# Patient Record
Sex: Female | Born: 1990 | Race: Black or African American | Hispanic: No | Marital: Single
Health system: Southern US, Community
[De-identification: ages and names within clinical notes are randomized; demographics above are authoritative.]

---
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
## 2022-07-07 ENCOUNTER — Emergency Department (HOSPITAL_COMMUNITY): Payer: BC Managed Care – PPO

## 2022-07-07 ENCOUNTER — Emergency Department (HOSPITAL_COMMUNITY)
Admission: EM | Admit: 2022-07-07 | Discharge: 2022-07-08 | Disposition: A | Discharge status: Home / Self Care | Payer: BC Managed Care – PPO | Attending: Emergency Medicine | Admitting: Emergency Medicine

## 2022-07-07 ENCOUNTER — Other Ambulatory Visit: Payer: Self-pay

## 2022-07-07 DIAGNOSIS — R8290 Unspecified abnormal findings in urine: Secondary | ICD-10-CM

## 2022-07-07 DIAGNOSIS — R1900 Intra-abdominal and pelvic swelling, mass and lump, unspecified site: Secondary | ICD-10-CM

## 2022-07-07 DIAGNOSIS — R1031 Right lower quadrant pain: Secondary | ICD-10-CM | POA: Insufficient documentation

## 2022-07-07 DIAGNOSIS — N9489 Other specified conditions associated with female genital organs and menstrual cycle: Secondary | ICD-10-CM | POA: Insufficient documentation

## 2022-07-07 DIAGNOSIS — R1032 Left lower quadrant pain: Secondary | ICD-10-CM | POA: Insufficient documentation

## 2022-07-07 LAB — COMPREHENSIVE METABOLIC PANEL
ALT: 14 U/L (ref 0–44)
AST: 16 U/L (ref 15–41)
Albumin: 4.1 g/dL (ref 3.5–5.0)
Alkaline Phosphatase: 55 U/L (ref 38–126)
Anion gap: 9 (ref 5–15)
BUN: 10 mg/dL (ref 6–20)
CO2: 22 mmol/L (ref 22–32)
Calcium: 9 mg/dL (ref 8.9–10.3)
Chloride: 108 mmol/L (ref 98–111)
Creatinine, Ser: 0.83 mg/dL (ref 0.44–1.00)
GFR, Estimated: >60 mL/min (ref >60)
Glucose, Bld: 93 mg/dL (ref 70–99)
Potassium: 4.1 mmol/L (ref 3.5–5.1)
Sodium: 139 mmol/L (ref 135–145)
Total Bilirubin: 0.6 mg/dL (ref 0.3–1.2)
Total Protein: 7.8 g/dL (ref 6.5–8.1)

## 2022-07-07 LAB — URINALYSIS, ROUTINE W REFLEX MICROSCOPIC
Bilirubin Urine: NEGATIVE
Glucose, UA: NEGATIVE mg/dL
Ketones, ur: 15 mg/dL — AB
Nitrite: NEGATIVE
Protein, ur: NEGATIVE mg/dL
Specific Gravity, Urine: >=1.03 (ref 1.005–1.030)
pH: 6 (ref 5.0–8.0)

## 2022-07-07 LAB — CBC WITH DIFFERENTIAL/PLATELET
Abs Immature Granulocytes: 0.02 10*3/uL (ref 0.00–0.07)
Basophils Absolute: 0 10*3/uL (ref 0.0–0.1)
Basophils Relative: 0 %
Eosinophils Absolute: 0.1 10*3/uL (ref 0.0–0.5)
Eosinophils Relative: 1 %
HCT: 37.5 % (ref 36.0–46.0)
Hemoglobin: 12.8 g/dL (ref 12.0–15.0)
Immature Granulocytes: 0 %
Lymphocytes Relative: 24 %
Lymphs Abs: 1.8 10*3/uL (ref 0.7–4.0)
MCH: 30.9 pg (ref 26.0–34.0)
MCHC: 34.1 g/dL (ref 30.0–36.0)
MCV: 90.6 fL (ref 80.0–100.0)
Monocytes Absolute: 0.7 10*3/uL (ref 0.1–1.0)
Monocytes Relative: 9 %
Neutro Abs: 4.9 10*3/uL (ref 1.7–7.7)
Neutrophils Relative %: 66 %
Platelets: 292 10*3/uL (ref 150–400)
RBC: 4.14 MIL/uL (ref 3.87–5.11)
RDW: 13 % (ref 11.5–15.5)
WBC: 7.4 10*3/uL (ref 4.0–10.5)
nRBC: 0 % (ref 0.0–0.2)

## 2022-07-07 LAB — I-STAT BETA HCG BLOOD, ED (MC, WL, AP ONLY): I-stat hCG, quantitative: <5 m[IU]/mL (ref <5)

## 2022-07-07 LAB — LIPASE, BLOOD: Lipase: 31 U/L (ref 11–51)

## 2022-07-07 MED ORDER — IOHEXOL 300 MG/ML  SOLN
100.0000 mL | Freq: Once | INTRAMUSCULAR | Status: AC | PRN
  Administered 2022-07-07: 80 mL via INTRAVENOUS

## 2022-07-07 MED ORDER — MORPHINE SULFATE (PF) 4 MG/ML IV SOLN
4.0000 mg | Freq: Once | INTRAVENOUS | Status: AC
  Administered 2022-07-07: 4 mg via INTRAVENOUS
  Filled 2022-07-07: qty 1

## 2022-07-07 MED ORDER — SODIUM CHLORIDE 0.9 % IV SOLN
1.0000 g | Freq: Once | INTRAVENOUS | Status: AC
  Administered 2022-07-08: 1 g via INTRAVENOUS
  Filled 2022-07-07: qty 10

## 2022-07-07 MED ORDER — SODIUM CHLORIDE (PF) 0.9 % IJ SOLN
INTRAMUSCULAR | Status: AC
  Filled 2022-07-07: qty 50

## 2022-07-07 NOTE — ED Provider Notes (Signed)
[facility]-EMERGENCY DEPT Provider Note   CSN: [id number] Arrival date & time: 07/07/22  1855     History  Chief Complaint  Patient presents with   Abdominal Pain    Chelsea Huffman is a 31 y.o. female.  With history of autoimmune arthritis initially showed acute presents ED for evaluation of bilateral lower quadrant abdominal pain radiating chest pain started 4 days ago.  It has gotten worse pain at the time and is difficult to sleep through the night.  Pain is constant and she describes it as sharp and shooting.  It does occasionally radiate to the back.  There is associated nausea with 1 episode of emesis this morning.  She rates the pain at a 7 out of 10 at this time.  She also has dysuria, frequency and urgency.  States she has had UTIs in the past and that this does not feel like a UTI.  She believes it feels worse.  She denies chest pain, shortness of breath, fevers, chills, diarrhea, vaginal symptoms.  She reports she did start her menstrual cycle on 07/03/2022.  She took an Excedrin this morning which did relieve her symptoms for approximately 4 hours.   Abdominal Pain Associated symptoms: dysuria, nausea and vomiting        Home Medications Prior to Admission medications   Not on File      Allergies    Nortriptyline    Review of Systems   Review of Systems  Gastrointestinal:  Positive for abdominal pain, nausea and vomiting.  Genitourinary:  Positive for dysuria, frequency and urgency.  All other systems reviewed and are negative.   Physical Exam Updated Vital Signs BP (!) 115/94   Pulse 95   Temp 98.1 F (36.7 C) (Oral)   Resp 16   Ht 5\' 5"  (1.651 m)   Wt 62.1 kg   LMP 07/07/2022 (Approximate)   SpO2 100%   BMI 22.80 kg/m  Physical Exam Vitals and nursing note reviewed.  Constitutional:      General: She is not in acute distress.    Appearance: She is well-developed and normal weight. She is not ill-appearing, toxic-appearing  or diaphoretic.  HENT:     Head: Normocephalic and atraumatic.  Eyes:     Conjunctiva/sclera: Conjunctivae normal.  Cardiovascular:     Rate and Rhythm: Normal rate and regular rhythm.     Heart sounds: No murmur heard. Pulmonary:     Effort: Pulmonary effort is normal. No respiratory distress.     Breath sounds: Normal breath sounds. No stridor. No wheezing, rhonchi or rales.  Abdominal:     Palpations: Abdomen is soft.     Tenderness: There is abdominal tenderness in the right lower quadrant and left lower quadrant. There is no right CVA tenderness, left CVA tenderness, guarding or rebound. Negative signs include Murphy's sign, Rovsing's sign, McBurney's sign, psoas sign and obturator sign.  Musculoskeletal:        General: No swelling.     Cervical back: Neck supple.  Skin:    General: Skin is warm and dry.     Capillary Refill: Capillary refill takes less than 2 seconds.  Neurological:     Mental Status: She is alert.  Psychiatric:        Mood and Affect: Mood normal.     ED Results / Procedures / Treatments   Labs (all labs ordered are listed, but only abnormal results are displayed) Labs Reviewed  CBC WITH DIFFERENTIAL/PLATELET  COMPREHENSIVE METABOLIC  PANEL  LIPASE, BLOOD  URINALYSIS, ROUTINE W REFLEX MICROSCOPIC  I-STAT BETA HCG BLOOD, ED ([facility], [facility], [facility] ONLY)    EKG None  Radiology No results found.  Procedures Procedures    Medications Ordered in ED Medications  morphine (PF) 4 MG/ML injection 4 mg (4 mg Intravenous Given 07/07/22 2140)    ED Course/ Medical Decision Making/ A&P Clinical Course as of 07/07/22 2203  Mon Jul 07, 2022  2153 Reevaluation shows patient's pain has improved with IV pain medication. Resting comfortably in bed. [AS]    Clinical Course User Index [AS] Cyrah Mclamb, Edsel Petrin, PA-C                     [facility] Syncope Rule Score: 0      Medical Decision Making Amount and/or Complexity of Data Reviewed Radiology:  ordered.  Risk Prescription drug management.  This patient presents to the ED for concern of LLQ and RLQ abdominal pain, this involves an extensive number of treatment options, and is a complaint that carries with it a high risk of complications and morbidity.  The differential diagnosis for generalized abdominal pain includes, but is not limited to AAA, gastroenteritis, appendicitis, Bowel obstruction, Bowel perforation. Gastroparesis, DKA, Hernia, Inflammatory bowel disease, mesenteric ischemia, pancreatitis, peritonitis SBP, volvulus.   Co morbidities that complicate the patient evaluation  reported autoimmune arthritis  My initial workup includes abdominal pain labs, IV pain control, CT abdomen pelvis  Additional history obtained from: Nursing notes from this visit.  I ordered, reviewed and interpreted labs which include: BMP, CBC, lipase, beta-hCG, urinalysis.  All blood labs within normal limits.  Urinalysis pending at the time of shift change.  I ordered imaging studies including CT abdomen pelvis I independently visualized and interpreted imaging which showed pending at the time of shift change I agree with the radiologist interpretation  Afebrile, hemodynamically stable.  31 year old female presenting to the ED for evaluation of bilateral lower quadrant abdominal pain.  Her physical exam is remarkable for tenderness to palpation bilateral lower quadrants.  She does have urinary symptoms as well, but states it does not feel like UTI.  Shared decision making conversation was had with patient regarding imaging of the abdomen.  Patient reports that she is here traveling from out of town and would like to make sure nothing is wrong.  Will have low suspicion for acute intraabdominal abnormalities as the cause of her symptoms.  Her symptoms did start the day that her menstrual cycle started and this may be contributing.  She also states that her autoimmune arthritis may be contributing as  well.  Her history is concerning for UTI.  Patient care will be handed off to [place], [place] pending CT scan and urinalysis.  If scan is normal, she will be treated for her symptoms.  If urinalysis shows signs of infection she will be treated for UTI.  Plan may change at the discretion of the oncoming provider.  Please see his note for final disposition and decision making.  At this time there does not appear to be any evidence of an acute emergency medical condition and the patient appears stable for discharge with appropriate outpatient follow up. Diagnosis was discussed with patient who verbalizes understanding of care plan and is agreeable to discharge. I have discussed return precautions with patient who verbalizes understanding. Patient encouraged to follow-up with their PCP within 1 week. All questions answered.  Note: Portions of this report may have been transcribed using voice recognition  software. Every effort was made to ensure accuracy; however, inadvertent computerized transcription errors may still be present.         Final Clinical Impression(s) / ED Diagnoses Final diagnoses:  None    Rx / DC Orders ED Discharge Orders     None         Michelle Piper, Cordelia Poche 07/07/22 2203    Tanda Rockers A, DO 07/08/22 1621

## 2022-07-07 NOTE — ED Triage Notes (Signed)
Pt reports LLQ abdominal/pelvic pain rated 7.5/10 x 4 days. Has taken excedrin at home with some improvement in symptoms. Pt reports 1 episode of emesis, denies discharge. Currently menstruating and states she has had some burning with urination.

## 2022-07-07 NOTE — ED Provider Triage Note (Signed)
Emergency Medicine Provider Triage Evaluation Note  Chelsea Huffman , a 31 y.o. female  was evaluated in triage.  Pt complains of 4 days of left lower quadrant abdominal/pelvic pain.  Patient states that it is achy, sharp, 7 out of 10.  She took Excedrin Migraine which helped relieve her symptoms.  She denies any vaginal symptoms.  She is currently menstruating with onset of herMenstrual cycle on 07/03/2022. She had 1 episode of vomiting this morning and has had increased urinary frequency. Review of Systems  Positive: Left lower quadrant pain Negative: Fever  Physical Exam  BP (!) 133/101 (BP Location: Left Arm)   Pulse 95   Temp 97.9 F (36.6 C) (Oral)   Resp 16   Ht 5\' 5"  (1.651 m)   Wt 62.1 kg   LMP 07/07/2022 (Approximate)   SpO2 100%   BMI 22.80 kg/m  Gen:   Awake, no distress   Resp:  Normal effort  MSK:   Moves extremities without difficulty  Other:  Tender to palpation left lower quadrant  Medical Decision Making  Medically screening exam initiated at 7:45 PM.  Appropriate orders placed.  Chelsea Huffman was informed that the remainder of the evaluation will be completed by another provider, this initial triage assessment does not replace that evaluation, and the importance of remaining in the ED until their evaluation is complete.     Chelsea Charleston, PA-C 07/07/22 1948

## 2022-07-08 ENCOUNTER — Encounter (HOSPITAL_COMMUNITY): Payer: Self-pay | Admitting: Emergency Medicine

## 2022-07-08 DIAGNOSIS — R1031 Right lower quadrant pain: Secondary | ICD-10-CM | POA: Diagnosis not present

## 2022-07-08 MED ORDER — IBUPROFEN 600 MG PO TABS: 600.0000 mg | ORAL_TABLET | Freq: Four times a day (QID) | ORAL | 0 refills | Status: AC | PRN

## 2022-07-08 MED ORDER — CEPHALEXIN 500 MG PO CAPS: 500.0000 mg | ORAL_CAPSULE | Freq: Two times a day (BID) | ORAL | 0 refills | Status: AC

## 2022-07-08 NOTE — Discharge Instructions (Signed)
You need to follow-up with an OBGYN.  You will need to have a this mass surgically removed.  I've printed the results to show your doctor.  Take medications as prescribed.  If symptoms change or worsen, return to the ER.

## 2022-07-08 NOTE — ED Provider Notes (Signed)
Patient here with gradually worsening abdominal pain for the past 4 days.  Denies fevers.  UA and CT pending.   Physical Exam  BP 119/79   Pulse 94   Temp 98.1 F (36.7 C)   Resp 16   Ht 5\' 5"  (1.651 m)   Wt 62.1 kg   LMP 07/07/2022 (Approximate)   SpO2 100%   BMI 22.80 kg/m   Physical Exam Vitals and nursing note reviewed.  Constitutional:      General: She is not in acute distress.    Appearance: She is well-developed.  HENT:     Head: Normocephalic and atraumatic.  Eyes:     Conjunctiva/sclera: Conjunctivae normal.  Cardiovascular:     Rate and Rhythm: Normal rate.     Heart sounds: No murmur heard. Pulmonary:     Effort: Pulmonary effort is normal. No respiratory distress.  Abdominal:     General: There is no distension.  Musculoskeletal:     Cervical back: Neck supple.     Comments: Moves all extremities  Skin:    General: Skin is warm and dry.  Neurological:     Mental Status: She is alert and oriented to person, place, and time.  Psychiatric:        Mood and Affect: Mood normal.        Behavior: Behavior normal.     Procedures  Procedures  ED Course / MDM   Clinical Course as of 07/08/22 0131  Mon Jul 07, 2022  2153 Reevaluation shows patient's pain has improved with IV pain medication. Resting comfortably in bed. [AS]    Clinical Course User Index [AS] [place], Jul 09, 2022, PA-C   Medical Decision Making Amount and/or Complexity of Data Reviewed Labs: ordered.    Details: No leukocytosis No electrolyte abnormality Radiology: ordered and independent interpretation performed.    Details: Large pelvic mass  Risk Prescription drug management. Decision regarding hospitalization.   I discussed the patient and her imaging results with Dr. Edsel Petrin, who advises outpatient follow-up for surgical removal.  I discussed this with the patient.  She will follow-up in [place], where [place] lives.  Will cover abnormal urinalysis with keflex.        [place], PA-C 07/08/22 0134    07/10/22, MD 07/08/22 [phone]
